# Patient Record
Sex: Female | Born: 1970 | Race: Black or African American | Hispanic: No | Marital: Single | Smoking: Never smoker
Health system: Southern US, Community
[De-identification: ages and names within clinical notes are randomized; demographics above are authoritative.]

---

## 1997-07-26 ENCOUNTER — Encounter (HOSPITAL_COMMUNITY): Admission: RE | Admit: 1997-07-26 | Discharge: 1997-08-02 | Payer: Self-pay | Admitting: Obstetrics & Gynecology

## 1997-07-31 ENCOUNTER — Inpatient Hospital Stay (HOSPITAL_COMMUNITY): Admission: AD | Admit: 1997-07-31 | Discharge: 1997-08-03 | Payer: Self-pay | Admitting: *Deleted

## 1998-02-11 ENCOUNTER — Inpatient Hospital Stay (HOSPITAL_COMMUNITY): Admission: AD | Admit: 1998-02-11 | Discharge: 1998-02-11 | Payer: Self-pay | Admitting: Obstetrics & Gynecology

## 1998-05-06 ENCOUNTER — Inpatient Hospital Stay (HOSPITAL_COMMUNITY): Admission: AD | Admit: 1998-05-06 | Discharge: 1998-05-06 | Payer: Self-pay | Admitting: *Deleted

## 1998-07-29 ENCOUNTER — Inpatient Hospital Stay (HOSPITAL_COMMUNITY): Admission: AD | Admit: 1998-07-29 | Discharge: 1998-07-29 | Payer: Self-pay | Admitting: *Deleted

## 1998-10-30 ENCOUNTER — Inpatient Hospital Stay (HOSPITAL_COMMUNITY): Admission: AD | Admit: 1998-10-30 | Discharge: 1998-10-30 | Payer: Self-pay | Admitting: *Deleted

## 1999-02-18 ENCOUNTER — Ambulatory Visit (HOSPITAL_COMMUNITY): Admission: RE | Admit: 1999-02-18 | Discharge: 1999-02-18 | Payer: Self-pay | Admitting: *Deleted

## 1999-08-05 ENCOUNTER — Encounter: Payer: Self-pay | Admitting: Emergency Medicine

## 1999-08-05 ENCOUNTER — Emergency Department (HOSPITAL_COMMUNITY): Admission: EM | Admit: 1999-08-05 | Discharge: 1999-08-05 | Payer: Self-pay | Admitting: Emergency Medicine

## 2001-04-22 ENCOUNTER — Encounter (INDEPENDENT_AMBULATORY_CARE_PROVIDER_SITE_OTHER): Payer: Self-pay

## 2001-04-22 ENCOUNTER — Observation Stay (HOSPITAL_COMMUNITY): Admission: AD | Admit: 2001-04-22 | Discharge: 2001-04-23 | Payer: Self-pay | Admitting: Obstetrics and Gynecology

## 2003-12-25 ENCOUNTER — Other Ambulatory Visit: Admission: RE | Admit: 2003-12-25 | Discharge: 2003-12-25 | Payer: Self-pay | Admitting: Obstetrics and Gynecology

## 2004-03-26 ENCOUNTER — Ambulatory Visit: Payer: Self-pay | Admitting: Pulmonary Disease

## 2006-09-30 ENCOUNTER — Ambulatory Visit: Payer: Self-pay | Admitting: Pulmonary Disease

## 2006-09-30 LAB — CONVERTED CEMR LAB
ALT: 22 units/L (ref 0–35)
AST: 19 units/L (ref 0–37)
Albumin: 4.1 g/dL (ref 3.5–5.2)
Alkaline Phosphatase: 32 units/L — ABNORMAL LOW (ref 39–117)
BUN: 12 mg/dL (ref 6–23)
Basophils Absolute: 0 10*3/uL (ref 0.0–0.1)
Basophils Relative: 0.3 % (ref 0.0–1.0)
Bilirubin Urine: NEGATIVE
Bilirubin, Direct: 0.1 mg/dL (ref 0.0–0.3)
CO2: 30 meq/L (ref 19–32)
Calcium: 9.7 mg/dL (ref 8.4–10.5)
Chloride: 106 meq/L (ref 96–112)
Cholesterol: 167 mg/dL (ref 0–200)
Creatinine, Ser: 0.8 mg/dL (ref 0.4–1.2)
Crystals: NEGATIVE
Eosinophils Absolute: 0.1 10*3/uL (ref 0.0–0.6)
Eosinophils Relative: 3.1 % (ref 0.0–5.0)
GFR calc Af Amer: 105 mL/min
GFR calc non Af Amer: 87 mL/min
Glucose, Bld: 91 mg/dL (ref 70–99)
HCT: 36.6 % (ref 36.0–46.0)
HDL: 48.8 mg/dL (ref >39.0)
Hemoglobin: 12.4 g/dL (ref 12.0–15.0)
Ketones, ur: NEGATIVE mg/dL
LDL Cholesterol: 112 mg/dL — ABNORMAL HIGH (ref 0–99)
Leukocytes, UA: NEGATIVE
Lymphocytes Relative: 31.8 % (ref 12.0–46.0)
MCHC: 33.9 g/dL (ref 30.0–36.0)
MCV: 89 fL (ref 78.0–100.0)
Monocytes Absolute: 0.3 10*3/uL (ref 0.2–0.7)
Monocytes Relative: 6.5 % (ref 3.0–11.0)
Mucus, UA: NEGATIVE
Neutro Abs: 2.8 10*3/uL (ref 1.4–7.7)
Neutrophils Relative %: 58.3 % (ref 43.0–77.0)
Nitrite: POSITIVE — AB
Platelets: 221 10*3/uL (ref 150–400)
Potassium: 4.1 meq/L (ref 3.5–5.1)
RBC: 4.11 M/uL (ref 3.87–5.11)
RDW: 13.7 % (ref 11.5–14.6)
Sodium: 141 meq/L (ref 135–145)
Specific Gravity, Urine: >=1.03 (ref 1.000–1.03)
TSH: 0.93 microintl units/mL (ref 0.35–5.50)
Total Bilirubin: 1.2 mg/dL (ref 0.3–1.2)
Total CHOL/HDL Ratio: 3.4
Total Protein, Urine: NEGATIVE mg/dL
Total Protein: 7.1 g/dL (ref 6.0–8.3)
Triglycerides: 29 mg/dL (ref 0–149)
Urine Glucose: NEGATIVE mg/dL
Urobilinogen, UA: 0.2 (ref 0.0–1.0)
VLDL: 6 mg/dL (ref 0–40)
WBC: 4.7 10*3/uL (ref 4.5–10.5)
pH: 6 (ref 5.0–8.0)

## 2009-11-07 ENCOUNTER — Encounter: Admission: RE | Admit: 2009-11-07 | Discharge: 2009-11-07 | Payer: Self-pay | Admitting: Infectious Diseases

## 2010-08-08 NOTE — Op Note (Signed)
[facility]  Patient:    Ann Terry, CIVIL Visit Number: [id number] MRN: [id number]          Service Type: EMS Location: [facility] Attending Physician:  Corlis Leak. Dictated by:   Janeece Riggers Dareen Piano, M.D. Proc. Date: 04/22/01 Admit Date:  04/22/2001 Discharge Date: 04/22/2001                             Operative Report  PREOPERATIVE DIAGNOSIS:       Left ectopic pregnancy.  POSTOPERATIVE DIAGNOSIS:      Left ectopic pregnancy with extensive pelvic                               adhesions.  OPERATION:  SURGEON:                      Mark E. Dareen Piano, M.D.  ANESTHESIA:                   General endotracheal.  ANTIBIOTICS:                  Cefotan 1 g.  DRAINS:                       Red rubber catheter in the bladder.  ESTIMATED BLOOD LOSS:         75 cc.  SPECIMENS:                    Left tube and ectopic pregnancy sent to                               pathology.  FINDINGS:                     The patient has a normal-appearing liver.  The gallbladder was not visualized.  The patient did have an omental adhesion to the anterior abdominal wall near the umbilicus.  This was taken down with sharp dissection at the conclusion of the procedure.  In the pelvis, the patient had an enlarged uterus which was boggy consistent with pregnancy.  The patient had approximately 1 L of blood in her abdomen.  The patient had evidence of past tubal ligation.  The left tube was dilated consistent with an ectopic pregnancy.  The right tube appeared to be normal.  However, was adherent to the posterior cul-de-sac.  In the posterior cul-de-sac, there was a 3-4 cm area of what appeared to necrotic tissue in the posterior cul-de-sac. There was no obvious ruptured diverticula or evidence of perforation of the bowel.  Both ovaries were visualized.  However, they were encapsuled in a significant amount of scar tissue.  INDICATIONS:                  The patient is a  40 year old black female, G2, P2, status post tubal ligation approximately three years ago by Dr. Deniece Ree.  The patient presented to [facility] complaining of a three week history of low abdominal pain.  She was worked up over there and found to have a positive pregnancy test, and an ultrasound was performed which revealed findings consistent with a left ectopic pregnancy.  The patient also had evidence of an urinary tract infection.  Prior to having  the procedure performed, I explained to the patient that she will need to have a left salpingectomy in light of her past tubal ligation.  The patient expressed her understanding of this prior to the procedure.  DESCRIPTION OF PROCEDURE:     The patient was taken to the operating room where she was placed in the dorsal supine position.  A general anesthetic was administered without complication.  She was then placed in the dorsolithotomy position, and prepped with Hibiclens.  The bladder was drained with a red rubber catheter.  A Hulka tenaculum was applied to the anterior cervical lip. The patient was then draped in the usual fashion for this procedure.  Her umbilicus was injected with 0.25% Marcaine.  A vertical skin incision was made.  The Veress needle was then placed into the peritoneal cavity and 3 L of carbon dioxide was insufflated.  The 10 mm trocar was then placed into the abdominal cavity.  The scope was then placed and findings were as noted above.  At this point, a 5 mm port was then placed in the suprapubic and left lower quadrant region under direct visualization.  A copious amount of blood and clots were evacuated from the pelvis.  Once this was performed, the ectopic pregnancy could be visualized.  It was adherent to the posterior cul-de-sac to several filmy adhesions.  These were all taken down with blunt dissection. The patient was discovered to have a large necrotic area in the posterior cul-de-sac which  the right fallopian tube was adherent to.  The right fallopian tube was dissected free of this area, and at this point, the fallopian tube on the _________ ovary was checked and felt to be normal.  Once the left fallopian tube was freed, the tripolar instrument was placed through the 5 mm port, and the tube cauterized and transected in the isthmic region.  Once it was freed, hemostasis was checked and felt to be adequate. The 5 mm port in the suprapubic region was then switched to a 10 mm port.  The Endobag was placed in the abdominal cavity and the ectopic pregnancy placed in the Endobag.  This was brought up through the abdominal wall and then removed.  Once this was accomplished, the scope was then placed in the suprapubic region, and scissors were used to take down the omental adhesions near the umbilicus.  Following this, the pelvis was copiously irrigated and found to be hemostatic.  No obvious etiology could be determined for the necrotic area in the posterior cul-de-sac.  The bowel appeared to be normal.  At this point, the procedure was concluded.  The instruments were removed. The pneumoperitoneum released.  The fascia was closed with interrupted 0 Vicryl sutures and the skin closed with interrupted 4-0 Vicryl sutures.  The patient was extubated and taken to the recovery room in stable condition.  She will be kept overnight for observation.  She will be given RhoGAM if her blood type is Rh negative. Dictated by:   Janeece Riggers Dareen Piano, M.D. Attending Physician:  Corlis Leak DD:  04/22/01 TD:  04/25/01 Job: [id number]

## 2010-08-08 NOTE — Op Note (Signed)
[facility]  Patient:    Ann Terry                          MRN: [id number] Proc. Date: 02/18/99 Adm. Date:  [id number] Attending:  Deniece Ree                           Operative Report  PREOPERATIVE DIAGNOSIS:       Multiparity, desirous of permanent sterilization.   POSTOPERATIVE DIAGNOSIS:      Multiparity, desirous of permanent sterilization.   OPERATION:                    Laparoscopic bilateral tubal cauterization with tubal resection.  SURGEON:                      Deniece Ree, M.D.  ASSISTANT:  ANESTHESIA:                   General anesthesia.  ESTIMATED BLOOD LOSS:         Less than 25 cc.  COMPLICATIONS:                None.  CONDITION:                    The patient tolerated the procedure well and returned to the recovery room in satisfactory condition.  DESCRIPTION OF PROCEDURE:     The patient was taken to the operating room and prepped and draped in the usual sterile fashion for sterilization procedure. A speculum was placed in the vagina following which the anterior lip of the cervix was then grasped with a Christella Hartigan tenaculum.  The Acorn uterine manipulating cannula was then put into place.  A subumbilical incision was then made.  This was carried down to the fascia.  At this point, the Veress needle was introduced and approximately 3.5 liters of carbon dioxide was then infused without difficulty.  The laparoscopic trocar was placed through this incision following which the laparoscope was placed through this sleeve.  Visualization of the pelvic organs  came into view.  At this point, the tubes were identified.  The right tube was hen elevated and grasped and cauterized approximately 2 to 3 cm in length.  The left tube was done likewise.  Following which the cauterized segment of tube was then cut into two locations.  At this point, hemostasis remained present.  Sponge, needle, and instrument counts were  correct x 2.  The carbon dioxide was then allowed to escape from the abdominal cavity which it did so without any problems. The incision was then closed first with a deep interrupted stitch followed by a  subcuticular stitch using 4-0 Vicryl.  The procedure was terminated.  The patient tolerated the procedure well and returned to the recovery room in satisfactory condition.  The patient is to be discharged when fully alert.  She has been instructed on the possible complications and care following this type of surgery. She has been told to return to my office in four weeks for follow-up evaluation or to call me prior to that time should any problems arise. DD:  02/18/99 TD:  02/18/99 Job: [id number]

## 2013-03-07 ENCOUNTER — Emergency Department (INDEPENDENT_AMBULATORY_CARE_PROVIDER_SITE_OTHER): Payer: Self-pay

## 2013-03-07 ENCOUNTER — Emergency Department (INDEPENDENT_AMBULATORY_CARE_PROVIDER_SITE_OTHER)
Admission: EM | Admit: 2013-03-07 | Discharge: 2013-03-07 | Disposition: A | Discharge status: Home / Self Care | Payer: Self-pay | Source: Home / Self Care

## 2013-03-07 ENCOUNTER — Encounter (HOSPITAL_COMMUNITY): Payer: Self-pay | Admitting: Emergency Medicine

## 2013-03-07 DIAGNOSIS — J069 Acute upper respiratory infection, unspecified: Secondary | ICD-10-CM

## 2013-03-07 LAB — POCT RAPID STREP A: Streptococcus, Group A Screen (Direct): NEGATIVE

## 2013-03-07 MED ORDER — IPRATROPIUM BROMIDE 0.06 % NA SOLN: 2.0000 | Freq: Four times a day (QID) | NASAL | Status: DC

## 2013-03-07 NOTE — ED Provider Notes (Signed)
CSN: [id number]     Arrival date & time 03/07/13  1109 History   None    Chief Complaint  Patient presents with  . Sore Throat   (Consider location/radiation/quality/duration/timing/severity/associated sxs/prior Treatment) Patient is a 42 y.o. female presenting with pharyngitis. The history is provided by the patient.  Sore Throat This is a new problem. The current episode started more than 1 week ago (51mo of sx ). The problem has not changed since onset.Pertinent negatives include no chest pain, no abdominal pain and no shortness of breath.    History reviewed. No pertinent past medical history. History reviewed. No pertinent past surgical history. History reviewed. No pertinent family history. History  Substance Use Topics  . Smoking status: Never Smoker   . Smokeless tobacco: Not on file  . Alcohol Use: Yes   OB History   Grav Para Term Preterm Abortions TAB SAB Ect Mult Living                 Review of Systems  Constitutional: Negative for fever.  HENT: Positive for congestion, postnasal drip and rhinorrhea.   Respiratory: Positive for cough. Negative for shortness of breath.   Cardiovascular: Negative for chest pain, palpitations and leg swelling.  Gastrointestinal: Negative.  Negative for abdominal pain.  Musculoskeletal: Negative.     Allergies  Review of patient's allergies indicates no known allergies.  Home Medications   Current Outpatient Rx  Name  Route  Sig  Dispense  Refill  . ipratropium (ATROVENT) 0.06 % nasal spray   Nasal   Place 2 sprays into the nose 4 (four) times daily.   15 mL   2    BP 162/93  Pulse 71  Temp(Src) 97.9 F (36.6 C) (Oral)  Resp 16  SpO2 100%  LMP 02/21/2013 Physical Exam  Nursing note and vitals reviewed. Constitutional: She is oriented to person, place, and time. She appears well-developed and well-nourished.  HENT:  Head: Normocephalic.  Right Ear: External ear normal.  Left Ear: External ear normal.   Mouth/Throat: Oropharynx is clear and moist.  Eyes: Conjunctivae are normal. Pupils are equal, round, and reactive to light.  Neck: Normal range of motion. Neck supple.  Cardiovascular: Normal rate, regular rhythm, normal heart sounds and intact distal pulses.   Pulmonary/Chest: Effort normal and breath sounds normal.  Lymphadenopathy:    She has no cervical adenopathy.  Neurological: She is alert and oriented to person, place, and time.  Skin: Skin is warm and dry.    ED Course  Procedures (including critical care time) Labs Review Labs Reviewed  POCT RAPID STREP A ([facility] URG CARE ONLY)   Imaging Review Dg Chest 2 View  03/07/2013   CLINICAL DATA:  Cough and congestion.  EXAM: CHEST  2 VIEW  COMPARISON:  11/07/2009  FINDINGS: The heart size and mediastinal contours are within normal limits. Both lungs are clear. The visualized skeletal structures are unremarkable.  IMPRESSION: No active cardiopulmonary disease.   Electronically Signed   By: Richarda Overlie M.D.   On: 03/07/2013 13:18    EKG Interpretation    Date/Time:    Ventricular Rate:    PR Interval:    QRS Duration:   QT Interval:    QTC Calculation:   R Axis:     Text Interpretation:              MDM  X-rays reviewed and report per radiologist.     Linna Hoff, MD 03/07/13 1325

## 2013-03-07 NOTE — ED Notes (Signed)
Pt  Reports      Symptoms  Of  sorethroat             Tightness  In  Chest        Body  Aches      And    Coughing   With  The  Symptoms          Beginning  About         1  Month   Ago

## 2013-03-09 LAB — CULTURE, GROUP A STREP

## 2014-06-04 ENCOUNTER — Telehealth: Payer: Self-pay | Admitting: Pulmonary Disease

## 2014-06-04 NOTE — Telephone Encounter (Signed)
lmtcb X1 for pt  

## 2014-06-05 NOTE — Telephone Encounter (Signed)
lmomtcb x 2  

## 2014-06-06 NOTE — Telephone Encounter (Signed)
Lmtcb. Will close per triage protocol.  

## 2014-06-21 ENCOUNTER — Other Ambulatory Visit: Payer: Self-pay | Admitting: Obstetrics and Gynecology

## 2014-06-25 ENCOUNTER — Other Ambulatory Visit: Payer: Self-pay | Admitting: Obstetrics and Gynecology

## 2014-06-25 DIAGNOSIS — R928 Other abnormal and inconclusive findings on diagnostic imaging of breast: Secondary | ICD-10-CM

## 2014-06-27 LAB — CYTOLOGY - PAP

## 2014-07-13 ENCOUNTER — Other Ambulatory Visit: Payer: Self-pay

## 2014-08-10 ENCOUNTER — Other Ambulatory Visit: Payer: Self-pay | Admitting: Obstetrics and Gynecology

## 2014-08-10 ENCOUNTER — Ambulatory Visit
Admission: RE | Admit: 2014-08-10 | Discharge: 2014-08-10 | Disposition: A | Discharge status: Home / Self Care | Payer: BLUE CROSS/BLUE SHIELD | Source: Ambulatory Visit | Attending: Obstetrics and Gynecology | Admitting: Obstetrics and Gynecology

## 2014-08-10 DIAGNOSIS — R928 Other abnormal and inconclusive findings on diagnostic imaging of breast: Secondary | ICD-10-CM

## 2014-08-10 DIAGNOSIS — N6320 Unspecified lump in the left breast, unspecified quadrant: Secondary | ICD-10-CM

## 2014-08-16 ENCOUNTER — Ambulatory Visit
Admission: RE | Admit: 2014-08-16 | Discharge: 2014-08-16 | Disposition: A | Discharge status: Home / Self Care | Payer: BLUE CROSS/BLUE SHIELD | Source: Ambulatory Visit | Attending: Obstetrics and Gynecology | Admitting: Obstetrics and Gynecology

## 2014-08-16 ENCOUNTER — Other Ambulatory Visit: Payer: Self-pay | Admitting: Diagnostic Radiology

## 2014-08-16 ENCOUNTER — Other Ambulatory Visit (HOSPITAL_COMMUNITY): Payer: Self-pay | Admitting: Diagnostic Radiology

## 2014-08-16 ENCOUNTER — Other Ambulatory Visit: Payer: Self-pay | Admitting: Obstetrics and Gynecology

## 2014-08-16 DIAGNOSIS — N6320 Unspecified lump in the left breast, unspecified quadrant: Secondary | ICD-10-CM

## 2015-02-04 ENCOUNTER — Ambulatory Visit
Admission: EM | Admit: 2015-02-04 | Discharge: 2015-02-04 | Disposition: A | Discharge status: Home / Self Care | Payer: BLUE CROSS/BLUE SHIELD | Attending: Family Medicine | Admitting: Family Medicine

## 2015-02-04 ENCOUNTER — Encounter: Payer: Self-pay | Admitting: Emergency Medicine

## 2015-02-04 DIAGNOSIS — K529 Noninfective gastroenteritis and colitis, unspecified: Secondary | ICD-10-CM | POA: Diagnosis not present

## 2015-02-04 DIAGNOSIS — N390 Urinary tract infection, site not specified: Secondary | ICD-10-CM | POA: Diagnosis not present

## 2015-02-04 DIAGNOSIS — M545 Low back pain, unspecified: Secondary | ICD-10-CM

## 2015-02-04 LAB — URINALYSIS COMPLETE WITH MICROSCOPIC (ARMC ONLY)
Bilirubin Urine: NEGATIVE
Glucose, UA: NEGATIVE mg/dL
Ketones, ur: NEGATIVE mg/dL
Nitrite: POSITIVE — AB
PH: 5.5 (ref 5.0–8.0)
PROTEIN: NEGATIVE mg/dL
SPECIFIC GRAVITY, URINE: 1.02 (ref 1.005–1.030)

## 2015-02-04 MED ORDER — ACETAMINOPHEN 500 MG PO TABS: 1000.0000 mg | ORAL_TABLET | Freq: Four times a day (QID) | ORAL | Status: AC | PRN

## 2015-02-04 MED ORDER — ONDANSETRON 8 MG PO TBDP: 8.0000 mg | ORAL_TABLET | Freq: Two times a day (BID) | ORAL | Status: AC | PRN

## 2015-02-04 MED ORDER — SULFAMETHOXAZOLE-TRIMETHOPRIM 800-160 MG PO TABS: 1.0000 | ORAL_TABLET | Freq: Two times a day (BID) | ORAL | Status: AC

## 2015-02-04 MED ORDER — PHENAZOPYRIDINE HCL 200 MG PO TABS: 200.0000 mg | ORAL_TABLET | Freq: Three times a day (TID) | ORAL | Status: AC

## 2015-02-04 NOTE — Discharge Instructions (Signed)
Urinary Tract Infection °Urinary tract infections (UTIs) can develop anywhere along your urinary tract. Your urinary tract is your body's drainage system for removing wastes and extra water. Your urinary tract includes two kidneys, two ureters, a bladder, and a urethra. Your kidneys are a pair of bean-shaped organs. Each kidney is about the size of your fist. They are located below your ribs, one on each side of your spine. °CAUSES °Infections are caused by microbes, which are microscopic organisms, including fungi, viruses, and bacteria. These organisms are so small that they can only be seen through a microscope. Bacteria are the microbes that most commonly cause UTIs. °SYMPTOMS  °Symptoms of UTIs may vary by age and gender of the patient and by the location of the infection. Symptoms in young women typically include a frequent and intense urge to urinate and a painful, burning feeling in the bladder or urethra during urination. Older women and men are more likely to be tired, shaky, and weak and have muscle aches and abdominal pain. A fever may mean the infection is in your kidneys. Other symptoms of a kidney infection include pain in your back or sides below the ribs, nausea, and vomiting. °DIAGNOSIS °To diagnose a UTI, your caregiver will ask you about your symptoms. Your caregiver will also ask you to provide a urine sample. The urine sample will be tested for bacteria and white blood cells. White blood cells are made by your body to help fight infection. °TREATMENT  °Typically, UTIs can be treated with medication. Because most UTIs are caused by a bacterial infection, they usually can be treated with the use of antibiotics. The choice of antibiotic and length of treatment depend on your symptoms and the type of bacteria causing your infection. °HOME CARE INSTRUCTIONS °· If you were prescribed antibiotics, take them exactly as your caregiver instructs you. Finish the medication even if you feel better after  you have only taken some of the medication. °· Drink enough water and fluids to keep your urine clear or pale yellow. °· Avoid caffeine, tea, and carbonated beverages. They tend to irritate your bladder. °· Empty your bladder often. Avoid holding urine for long periods of time. °· Empty your bladder before and after sexual intercourse. °· After a bowel movement, women should cleanse from front to back. Use each tissue only once. °SEEK MEDICAL CARE IF:  °· You have back pain. °· You develop a fever. °· Your symptoms do not begin to resolve within 3 days. °SEEK IMMEDIATE MEDICAL CARE IF:  °· You have severe back pain or lower abdominal pain. °· You develop chills. °· You have nausea or vomiting. °· You have continued burning or discomfort with urination. °MAKE SURE YOU:  °· Understand these instructions. °· Will watch your condition. °· Will get help right away if you are not doing well or get worse. °  °This information is not intended to replace advice given to you by your health care provider. Make sure you discuss any questions you have with your health care provider. °  °Document Released: 12/17/2004 Document Revised: 11/28/2014 Document Reviewed: 04/17/2011 °Elsevier Interactive Patient Education ©2016 [employer] ° ° °Viral Gastroenteritis °Viral gastroenteritis is also known as stomach flu. This condition affects the stomach and intestinal tract. It can cause sudden diarrhea and vomiting. The illness typically lasts 3 to 8 days. Most people develop an immune response that eventually gets rid of the virus. While this natural response develops, the virus can make you quite ill. °CAUSES  °  Many different viruses can cause gastroenteritis, such as rotavirus or noroviruses. You can catch one of these viruses by consuming contaminated food or water. You may also catch a virus by sharing utensils or other personal items with an infected person or by touching a contaminated surface. °SYMPTOMS  °The most common  symptoms are diarrhea and vomiting. These problems can cause a severe loss of body fluids (dehydration) and a body salt (electrolyte) imbalance. Other symptoms may include: °· Fever. °· Headache. °· Fatigue. °· Abdominal pain. °DIAGNOSIS  °Your caregiver can usually diagnose viral gastroenteritis based on your symptoms and a physical exam. A stool sample may also be taken to test for the presence of viruses or other infections. °TREATMENT  °This illness typically goes away on its own. Treatments are aimed at rehydration. The most serious cases of viral gastroenteritis involve vomiting so severely that you are not able to keep fluids down. In these cases, fluids must be given through an intravenous line (IV). °HOME CARE INSTRUCTIONS  °· Drink enough fluids to keep your urine clear or pale yellow. Drink small amounts of fluids frequently and increase the amounts as tolerated. °· Ask your caregiver for specific rehydration instructions. °· Avoid: °· Foods high in sugar. °· Alcohol. °· Carbonated drinks. °· Tobacco. °· Juice. °· Caffeine drinks. °· Extremely hot or cold fluids. °· Fatty, greasy foods. °· Too much intake of anything at one time. °· Dairy products until 24 to 48 hours after diarrhea stops. °· You may consume probiotics. Probiotics are active cultures of beneficial bacteria. They may lessen the amount and number of diarrheal stools in adults. Probiotics can be found in yogurt with active cultures and in supplements. °· Wash your hands well to avoid spreading the virus. °· Only take over-the-counter or prescription medicines for pain, discomfort, or fever as directed by your caregiver. Do not give aspirin to children. Antidiarrheal medicines are not recommended. °· Ask your caregiver if you should continue to take your regular prescribed and over-the-counter medicines. °· Keep all follow-up appointments as directed by your caregiver. °SEEK IMMEDIATE MEDICAL CARE IF:  °· You are unable to keep fluids  down. °· You do not urinate at least once every 6 to 8 hours. °· You develop shortness of breath. °· You notice blood in your stool or vomit. This may look like coffee grounds. °· You have abdominal pain that increases or is concentrated in one small area (localized). °· You have persistent vomiting or diarrhea. °· You have a fever. °· The patient is a child younger than 3 months, and he or she has a fever. °· The patient is a child older than 3 months, and he or she has a fever and persistent symptoms. °· The patient is a child older than 3 months, and he or she has a fever and symptoms suddenly get worse. °· The patient is a baby, and he or she has no tears when crying. °MAKE SURE YOU:  °· Understand these instructions. °· Will watch your condition. °· Will get help right away if you are not doing well or get worse. °  °This information is not intended to replace advice given to you by your health care provider. Make sure you discuss any questions you have with your health care provider. °  °Document Released: 03/09/2005 Document Revised: 06/01/2011 Document Reviewed: 12/24/2010 °Elsevier Interactive Patient Education ©2016 [employer] ° °

## 2015-02-04 NOTE — ED Notes (Signed)
Patient c/o lower back pain, lower abdominal pain, burning when urinating, and increase in urinary frequency for 2 months.  Patient reports chills

## 2015-02-05 NOTE — ED Provider Notes (Signed)
CSN: [id number]     Arrival date & time 02/04/15  1252 History   First MD Initiated Contact with Patient 02/04/15 1512     Chief Complaint  Patient presents with  . Abdominal Pain  . Dysuria  . Back Pain   (Consider location/radiation/quality/duration/timing/severity/associated sxs/prior Treatment) HPI Comments: Single african [place] female here for evaluation of lumbar pain, bilateral groin pain, nausea, vomiting last week, headache worst of her headaches, slight headache today, foul odor intermittently to urine denied gross blood.  Urinary frequency x 2 months.  Intermittent burning with urination worsening this week.  + chills  Denied arm/leg weakness, saddle paresthesias or loss of bowel/bladder control.  Denied personal or family history of kidney stones  Patient is a 44 y.o. female presenting with abdominal pain, dysuria, and back pain. The history is provided by the patient.  Abdominal Pain Pain location:  LLQ and RLQ Pain quality: aching   Pain quality: not bloating, not burning, not cramping, not dull, no fullness, not gnawing, not heavy, no pressure, not sharp, not shooting, not squeezing, not stabbing, no stiffness, not tearing, not throbbing and not tugging   Pain radiates to:  Does not radiate Pain severity:  Moderate Onset quality:  Sudden Associated symptoms: chills, dysuria, hematuria, nausea and vomiting   Associated symptoms: no chest pain, no constipation, no cough, no diarrhea, no fatigue, no fever, no sore throat, no vaginal bleeding and no vaginal discharge   Dysuria Associated symptoms: abdominal pain, flank pain, nausea and vomiting   Associated symptoms: no fever and no vaginal discharge   Back Pain Associated symptoms: abdominal pain, dysuria and headaches   Associated symptoms: no chest pain, no fever, no numbness, no pelvic pain and no weakness     History reviewed. No pertinent past medical history. Past Surgical History  Procedure Laterality Date  .  Cesarean section     History reviewed. No pertinent family history. Social History  Substance Use Topics  . Smoking status: Never Smoker   . Smokeless tobacco: None  . Alcohol Use: Yes   OB History    No data available     Review of Systems  Constitutional: Positive for chills. Negative for fever, diaphoresis, activity change, appetite change, fatigue and unexpected weight change.  HENT: Negative for congestion, dental problem, drooling, ear discharge, ear pain, facial swelling, hearing loss, mouth sores, nosebleeds, postnasal drip, rhinorrhea, sinus pressure, sneezing, sore throat, trouble swallowing and voice change.   Eyes: Negative for pain and discharge.  Respiratory: Negative for cough, choking, chest tightness and wheezing.   Cardiovascular: Negative for chest pain and leg swelling.  Gastrointestinal: Positive for nausea, vomiting and abdominal pain. Negative for diarrhea, constipation, blood in stool, abdominal distention and anal bleeding.  Endocrine: Negative for cold intolerance and heat intolerance.  Genitourinary: Positive for dysuria, frequency, hematuria and flank pain. Negative for urgency, decreased urine volume, vaginal bleeding, vaginal discharge, enuresis, difficulty urinating, genital sores, vaginal pain, menstrual problem and pelvic pain.  Musculoskeletal: Positive for back pain. Negative for myalgias and arthralgias.  Skin: Negative for rash.  Allergic/Immunologic: Negative for environmental allergies and food allergies.  Neurological: Positive for headaches. Negative for dizziness, tremors, seizures, syncope, facial asymmetry, speech difficulty, weakness, light-headedness and numbness.  Hematological: Negative for adenopathy. Does not bruise/bleed easily.  Psychiatric/Behavioral: Negative for behavioral problems, confusion, sleep disturbance and agitation. The patient is not nervous/anxious.     Allergies  Review of patient's allergies indicates no known  allergies.  Home Medications   Prior to  Admission medications   Medication Sig Start Date End Date Taking? Authorizing Provider  acetaminophen (TYLENOL) 500 MG tablet Take 2 tablets (1,000 mg total) by mouth every 6 (six) hours as needed for moderate pain, fever or headache. 02/04/15 02/10/15  Barbaraann Barthel, NP  ipratropium (ATROVENT) 0.06 % nasal spray Place 2 sprays into the nose 4 (four) times daily. 03/07/13   Linna Hoff, MD  ondansetron (ZOFRAN ODT) 8 MG disintegrating tablet Take 1 tablet (8 mg total) by mouth 2 (two) times daily as needed for nausea or vomiting. 02/04/15 02/06/15  Barbaraann Barthel, NP  phenazopyridine (PYRIDIUM) 200 MG tablet Take 1 tablet (200 mg total) by mouth 3 (three) times daily. 02/04/15 02/05/15  Barbaraann Barthel, NP  sulfamethoxazole-trimethoprim (BACTRIM DS,SEPTRA DS) 800-160 MG tablet Take 1 tablet by mouth 2 (two) times daily. 02/04/15 02/10/15  Barbaraann Barthel, NP   Meds Ordered and Administered this Visit  Medications - No data to display  BP 142/80 mmHg  Pulse 72  Temp(Src) 97.3 F (36.3 C) (Tympanic)  Resp 16  Ht  (1.549 m)  Wt 178 lb (80.74 kg)  BMI 33.65 kg/m2  SpO2 100%  LMP 01/07/2015 (Exact Date) No data found.   Physical Exam  Constitutional: She is oriented to person, place, and time. She appears well-developed and well-nourished. She is active and cooperative.  Non-toxic appearance. She does not have a sickly appearance. She does not appear ill. No distress.  HENT:  Head: Normocephalic and atraumatic.  Right Ear: Hearing, external ear and ear canal normal.  Left Ear: Hearing, external ear and ear canal normal.  Nose: No mucosal edema, rhinorrhea, nose lacerations, sinus tenderness, nasal deformity, septal deviation or nasal septal hematoma. No epistaxis.  No foreign bodies. Right sinus exhibits no maxillary sinus tenderness and no frontal sinus tenderness. Left sinus exhibits no maxillary sinus tenderness and no frontal  sinus tenderness.  Mouth/Throat: Uvula is midline and mucous membranes are normal. Mucous membranes are not pale, not dry and not cyanotic. She does not have dentures. No oral lesions. No trismus in the jaw. Normal dentition. No dental abscesses, uvula swelling, lacerations or dental caries. No oropharyngeal exudate, posterior oropharyngeal edema, posterior oropharyngeal erythema or tonsillar abscesses.  Eyes: Conjunctivae, EOM and lids are normal. Pupils are equal, round, and reactive to light. Right eye exhibits no chemosis, no discharge, no exudate and no hordeolum. No foreign body present in the right eye. Left eye exhibits no chemosis, no discharge, no exudate and no hordeolum. No foreign body present in the left eye. Right conjunctiva is not injected. Right conjunctiva has no hemorrhage. Left conjunctiva is not injected. Left conjunctiva has no hemorrhage. No scleral icterus. Right eye exhibits normal extraocular motion and no nystagmus. Left eye exhibits normal extraocular motion and no nystagmus. Right pupil is round and reactive. Left pupil is round and reactive. Pupils are equal.  Neck: Trachea normal and normal range of motion. Neck supple. No tracheal tenderness, no spinous process tenderness and no muscular tenderness present. No rigidity. No tracheal deviation, no edema, no erythema and normal range of motion present. No thyroid mass and no thyromegaly present.  Cardiovascular: Normal rate, regular rhythm, S1 normal, S2 normal, normal heart sounds and intact distal pulses.  PMI is not displaced.  Exam reveals no gallop and no friction rub.   No murmur heard. Pulses:      Radial pulses are 2+ on the right side, and 2+ on the left side.  Pulmonary/Chest: Effort  normal and breath sounds normal. No accessory muscle usage or stridor. No respiratory distress. She has no decreased breath sounds. She has no wheezes. She has no rhonchi. She has no rales. She exhibits no tenderness.  Abdominal: Soft.  Bowel sounds are normal. She exhibits no shifting dullness, no distension, no pulsatile liver, no fluid wave, no abdominal bruit, no ascites, no pulsatile midline mass and no mass. There is no hepatosplenomegaly. There is tenderness in the right lower quadrant, suprapubic area and left lower quadrant. There is CVA tenderness. There is no rigidity, no rebound, no guarding, no tenderness at McBurney's point and negative Murphy's sign. Hernia confirmed negative in the ventral area, confirmed negative in the right inguinal area and confirmed negative in the left inguinal area.    TTP RLQ, LLQ, right and left inguinal areas no edema/swelling/hernia/adenopathy noted; bilateral CVA tenderness not exquisite. Bilaterally low lumbar pain across hips does not worsen with palpation worsens with extension/flexion/rotation; dull to percussion x 4 quads  Musculoskeletal: Normal range of motion. She exhibits no edema.       Right shoulder: Normal.       Left shoulder: Normal.       Right hip: Normal.       Left hip: Normal.       Right knee: Normal.       Left knee: Normal.       Cervical back: Normal.       Thoracic back: She exhibits tenderness and pain. She exhibits normal range of motion, no bony tenderness, no swelling, no edema, no deformity, no laceration, no spasm and normal pulse.       Lumbar back: She exhibits pain. She exhibits normal range of motion, no tenderness, no bony tenderness, no swelling, no edema, no deformity, no laceration, no spasm and normal pulse.       Back:       Right hand: Normal.       Left hand: Normal.  Lymphadenopathy:       Head (right side): No submental, no submandibular, no tonsillar, no preauricular, no posterior auricular and no occipital adenopathy present.       Head (left side): No submental, no submandibular, no tonsillar, no preauricular, no posterior auricular and no occipital adenopathy present.    She has no cervical adenopathy.       Right cervical: No  superficial cervical, no deep cervical and no posterior cervical adenopathy present.      Left cervical: No superficial cervical, no deep cervical and no posterior cervical adenopathy present.       Right: No inguinal adenopathy present.       Left: No inguinal adenopathy present.  Neurological: She is alert and oriented to person, place, and time. She has normal strength. She is not disoriented. She displays no atrophy and no tremor. No cranial nerve deficit or sensory deficit. She exhibits normal muscle tone. She displays no seizure activity. Coordination and gait normal. GCS eye subscore is 4. GCS verbal subscore is 5. GCS motor subscore is 6.  Skin: Skin is warm, dry and intact. No abrasion, no bruising, no burn, no ecchymosis, no laceration, no lesion, no petechiae and no rash noted. She is not diaphoretic. No cyanosis or erythema. No pallor. Nails show no clubbing.  Psychiatric: She has a normal mood and affect. Her speech is normal and behavior is normal. Judgment and thought content normal. Cognition and memory are normal.  Nursing note and vitals reviewed.   ED Course  Procedures (including critical care time)  Labs Review Labs Reviewed  URINALYSIS COMPLETEWITH MICROSCOPIC ([facility] ONLY) - Abnormal; Notable for the following:    Hgb urine dipstick 2+ (*)    Nitrite POSITIVE (*)    Leukocytes, UA TRACE (*)    Bacteria, UA MANY (*)    Squamous Epithelial / LPF 0-5 (*)    All other components within normal limits  URINE CULTURE    Imaging Review No results found.  1530 discussed urinalysis results with patient given copy.  Notified patient I would contact her typically 48 hours with urine culture results.  To call clinic if no improvement in her symptoms with 48 hours of antibiotics as may need to change antibiotics.  Consider KUB patient did not want at this time if continued RBCs in urine after antibiotic treatment.  Crystals negative.  [facility]  after antibiotics completed to ensure resolution UTI  Patient verbalized understanding of information/instructions, agreed with plan of care and had no further questions at this time.  MDM   1. UTI (lower urinary tract infection)   2. Gastroenteritis, acute    Medications as directed.  Rx bactrim DS po BID x 7 days and tylenol 1000mg  po QID prn.  Patient is also to push fluids and may use Pyridium po as needed. Call or return to clinic as needed if these symptoms worsen or fail to improve as anticipated.  Exitcare handout on cystitis given to patient Patient verbalized agreement and understanding of treatment plan and had no further questions at this time. P2:  Hydrate, post coital urination, and cranberry juice  Work/school note given to patient for return in 48 hours.  I have recommended clear fluids and the BRATT diet.  Medications as directed. zofran 8mg  ODT po BID prn nausea/vomiting given to patient Rx.  Patient did not want zofran administration in clinic.   Return to the clinic if symptoms persist or worsen; I have alerted the patient to call if high fever, dehydration, marked weakness, fainting, increased abdominal pain, blood in stool or vomit (red or black).   Exitcare handout on gastroenteritis given to patient. Patient verbalized agreement and understanding of treatment plan and had no further questions as needed.   P2:  Hand washing and fitness  For acute pain, rest, and intermittent application of heat (do not sleep on heating pad).  I discussed longer-term treatment plan of PRN PO NSAIDS tylenol 1000mg  po QID prn.  Discussed with patient probably related to UTI but could be other cause also.  I discussed a home back care exercise program with a strengthening and flexibility exercise.  Consider physical therapy or chiropractic care and radiology if not improving.  Call or return to clinic as needed if these symptoms worsen or fail to improve as anticipated especially leg weakness, loss of  bowel/bladder control or saddle paresthesias.   Patient verbalized understanding of instructions/information and agreed with plan of care.  P2:  Injury Prevention, fitness    Barbaraann Barthelina A Eriq Hufford, NP 02/05/15 1252

## 2015-02-06 ENCOUNTER — Telehealth: Payer: Self-pay | Admitting: Family Medicine

## 2015-02-06 LAB — URINE CULTURE: Culture: >=100000

## 2015-02-06 NOTE — Telephone Encounter (Signed)
Telephone message left for patient e. Coli sensitive to all antibiotics patient given bactrim DS at appt if symptoms not improving with medication to contact clinic to notify staff for change in antibiotic.

## 2015-02-15 ENCOUNTER — Telehealth: Payer: Self-pay | Admitting: Family Medicine

## 2015-02-15 NOTE — Telephone Encounter (Signed)
Telephone message left for patient urine culture e.coli sensitive to Bactrim.  Patient instructed to call and speak with nurse if still symptomatic or further questions/concerns.

## 2015-08-01 ENCOUNTER — Other Ambulatory Visit: Payer: Self-pay | Admitting: Obstetrics and Gynecology

## 2015-08-21 ENCOUNTER — Other Ambulatory Visit: Payer: Self-pay | Admitting: Obstetrics and Gynecology

## 2015-08-23 LAB — CYTOLOGY - PAP

## 2015-08-29 ENCOUNTER — Other Ambulatory Visit: Payer: Self-pay | Admitting: Obstetrics and Gynecology

## 2015-08-29 DIAGNOSIS — N63 Unspecified lump in unspecified breast: Secondary | ICD-10-CM

## 2015-09-19 ENCOUNTER — Other Ambulatory Visit: Payer: BLUE CROSS/BLUE SHIELD

## 2015-09-20 ENCOUNTER — Other Ambulatory Visit: Payer: BLUE CROSS/BLUE SHIELD

## 2015-09-26 ENCOUNTER — Other Ambulatory Visit: Payer: Self-pay | Admitting: Specialist

## 2015-09-27 ENCOUNTER — Other Ambulatory Visit: Payer: BLUE CROSS/BLUE SHIELD

## 2015-10-02 ENCOUNTER — Ambulatory Visit
Admission: RE | Admit: 2015-10-02 | Discharge: 2015-10-02 | Disposition: A | Discharge status: Home / Self Care | Payer: BLUE CROSS/BLUE SHIELD | Source: Ambulatory Visit | Attending: Obstetrics and Gynecology | Admitting: Obstetrics and Gynecology

## 2015-10-02 DIAGNOSIS — N63 Unspecified lump in unspecified breast: Secondary | ICD-10-CM

## 2015-10-25 ENCOUNTER — Other Ambulatory Visit: Payer: Self-pay | Admitting: Gastroenterology

## 2015-10-25 DIAGNOSIS — R1031 Right lower quadrant pain: Secondary | ICD-10-CM

## 2015-10-29 ENCOUNTER — Ambulatory Visit
Admission: RE | Admit: 2015-10-29 | Discharge: 2015-10-29 | Disposition: A | Discharge status: Home / Self Care | Payer: BLUE CROSS/BLUE SHIELD | Source: Ambulatory Visit | Attending: Gastroenterology | Admitting: Gastroenterology

## 2015-10-29 DIAGNOSIS — R1031 Right lower quadrant pain: Secondary | ICD-10-CM

## 2015-10-29 MED ORDER — IOPAMIDOL (ISOVUE-300) INJECTION 61%
100.0000 mL | Freq: Once | INTRAVENOUS | Status: AC | PRN
  Administered 2015-10-29: 100 mL via INTRAVENOUS

## 2015-12-20 ENCOUNTER — Ambulatory Visit (INDEPENDENT_AMBULATORY_CARE_PROVIDER_SITE_OTHER): Payer: Worker's Compensation

## 2015-12-20 ENCOUNTER — Ambulatory Visit (INDEPENDENT_AMBULATORY_CARE_PROVIDER_SITE_OTHER): Payer: Worker's Compensation | Admitting: Physician Assistant

## 2015-12-20 DIAGNOSIS — M25562 Pain in left knee: Secondary | ICD-10-CM

## 2015-12-20 DIAGNOSIS — M542 Cervicalgia: Secondary | ICD-10-CM

## 2015-12-20 DIAGNOSIS — M62838 Other muscle spasm: Secondary | ICD-10-CM | POA: Diagnosis not present

## 2015-12-20 MED ORDER — CYCLOBENZAPRINE HCL 5 MG PO TABS: 5.0000 mg | ORAL_TABLET | Freq: Three times a day (TID) | ORAL | 0 refills | Status: AC | PRN

## 2015-12-20 MED ORDER — MELOXICAM 7.5 MG PO TABS: 7.5000 mg | ORAL_TABLET | Freq: Every day | ORAL | 0 refills | Status: DC

## 2015-12-20 NOTE — Patient Instructions (Addendum)
  Heat to the sore muscle Ice to the knee  Take medications as needed  Recheck in a week    IF you received an x-ray today, you will [facility] an [facility]. Please contact [facility] at [phone] with questions or concerns regarding your invoice.   IF you received labwork today, you will receive an invoice from [facility]. Please contact [facility] at [phone] with questions or concerns regarding your invoice.   Our billing staff will not be able to assist you with questions regarding bills from these companies.  You will be contacted with the lab results as soon as they are available. The fastest way to get your results is to activate your My Chart account. Instructions are located on the last page of this paperwork. If you have not heard from us regarding the results in 2 weeks, please contact this office.
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
## 2015-12-27 ENCOUNTER — Ambulatory Visit (INDEPENDENT_AMBULATORY_CARE_PROVIDER_SITE_OTHER): Payer: Worker's Compensation | Admitting: Physician Assistant

## 2015-12-27 VITALS — BP 114/70 | HR 72 | Temp 98.8°F | Resp 16 | Ht 63.0 in | Wt 186.0 lb

## 2015-12-27 DIAGNOSIS — M542 Cervicalgia: Secondary | ICD-10-CM | POA: Diagnosis not present

## 2015-12-27 DIAGNOSIS — Y990 Civilian activity done for income or pay: Secondary | ICD-10-CM | POA: Diagnosis not present

## 2015-12-27 DIAGNOSIS — M25562 Pain in left knee: Secondary | ICD-10-CM

## 2015-12-27 MED ORDER — MELOXICAM 7.5 MG PO TABS: 7.5000 mg | ORAL_TABLET | Freq: Every day | ORAL | 0 refills | Status: AC

## 2015-12-27 NOTE — Patient Instructions (Signed)
     IF you received an x-ray today, you will receive an invoice from [facility]. Please contact [facility] at [phone] with questions or concerns regarding your invoice.   IF you received labwork today, you will receive an invoice from [facility]. Please contact [facility] at [phone] with questions or concerns regarding your invoice.   Our billing staff will not be able to assist you with questions regarding bills from these companies.  You will be contacted with the lab results as soon as they are available. The fastest way to get your results is to activate your My Chart account. Instructions are located on the last page of this paperwork. If you have not heard from us regarding the results in 2 weeks, please contact this office.      
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
## 2016-01-10 ENCOUNTER — Ambulatory Visit (INDEPENDENT_AMBULATORY_CARE_PROVIDER_SITE_OTHER): Payer: Worker's Compensation

## 2016-01-10 ENCOUNTER — Ambulatory Visit (INDEPENDENT_AMBULATORY_CARE_PROVIDER_SITE_OTHER): Payer: Worker's Compensation | Admitting: Physician Assistant

## 2016-01-10 ENCOUNTER — Encounter: Payer: Self-pay | Admitting: Physician Assistant

## 2016-01-10 VITALS — BP 132/100 | HR 67 | Temp 98.3°F | Resp 16 | Ht 62.0 in | Wt 183.4 lb

## 2016-01-10 DIAGNOSIS — Y990 Civilian activity done for income or pay: Secondary | ICD-10-CM

## 2016-01-10 DIAGNOSIS — M25562 Pain in left knee: Secondary | ICD-10-CM

## 2016-01-10 DIAGNOSIS — M542 Cervicalgia: Secondary | ICD-10-CM | POA: Diagnosis not present
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
## 2016-01-10 NOTE — Patient Instructions (Addendum)
  Heat and stretches to the neck Ice to the knee Medication as we have done in the past weedk  IF you received an x-ray today, you will receive an [facility] from [facility]. Please contact [facility] at [phone] with questions or concerns regarding your invoice.   IF you received labwork today, you will receive an invoice from [facility]. Please contact [facility] at [phone] with questions or concerns regarding your invoice.   Our billing staff will not be able to assist you with questions regarding bills from these companies.  You will be contacted with the lab results as soon as they are available. The fastest way to get your results is to activate your My Chart account. Instructions are located on the last page of this paperwork. If you have not heard from us regarding the results in 2 weeks, please contact this office.

## 2016-04-16 ENCOUNTER — Ambulatory Visit (INDEPENDENT_AMBULATORY_CARE_PROVIDER_SITE_OTHER): Payer: Worker's Compensation | Admitting: Physician Assistant

## 2016-04-16 ENCOUNTER — Encounter: Payer: Self-pay | Admitting: Physician Assistant

## 2016-04-16 VITALS — BP 149/92 | HR 68 | Temp 98.5°F | Ht 62.0 in | Wt 187.4 lb

## 2016-04-16 DIAGNOSIS — M542 Cervicalgia: Secondary | ICD-10-CM | POA: Diagnosis not present

## 2016-04-16 DIAGNOSIS — M25562 Pain in left knee: Secondary | ICD-10-CM | POA: Diagnosis not present

## 2016-04-16 DIAGNOSIS — M62838 Other muscle spasm: Secondary | ICD-10-CM

## 2016-04-16 DIAGNOSIS — S8002XD Contusion of left knee, subsequent encounter: Secondary | ICD-10-CM

## 2016-04-16 DIAGNOSIS — Y990 Civilian activity done for income or pay: Secondary | ICD-10-CM
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
## 2016-04-16 NOTE — Patient Instructions (Addendum)
   Continue doing PT and home exercises.  F/u with me if needed.  IF you received an x-ray today, you will receive an invoice from [facility]. Please contact [facility] at [phone] with questions or concerns regarding your invoice.   IF you received labwork today, you will receive an invoice from [facility]. Please contact [facility] at [phone] with questions or concerns regarding your invoice.   Our billing staff will not be able to assist you with questions regarding bills from these companies.  You will be contacted with the lab results as soon as they are available. The fastest way to get your results is to activate your My Chart account. Instructions are located on the last page of this paperwork. If you have not heard from us regarding the results in 2 weeks, please contact this office.
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
## 2018-04-20 ENCOUNTER — Other Ambulatory Visit: Payer: Self-pay | Admitting: Obstetrics and Gynecology

## 2018-04-20 DIAGNOSIS — R928 Other abnormal and inconclusive findings on diagnostic imaging of breast: Secondary | ICD-10-CM

## 2018-05-02 ENCOUNTER — Ambulatory Visit: Payer: BLUE CROSS/BLUE SHIELD

## 2018-05-02 ENCOUNTER — Ambulatory Visit
Admission: RE | Admit: 2018-05-02 | Discharge: 2018-05-02 | Disposition: A | Discharge status: Home / Self Care | Payer: BLUE CROSS/BLUE SHIELD | Source: Ambulatory Visit | Attending: Obstetrics and Gynecology | Admitting: Obstetrics and Gynecology

## 2018-05-02 DIAGNOSIS — R928 Other abnormal and inconclusive findings on diagnostic imaging of breast: Secondary | ICD-10-CM

## 2019-10-24 ENCOUNTER — Other Ambulatory Visit: Payer: BLUE CROSS/BLUE SHIELD

## 2020-01-20 ENCOUNTER — Ambulatory Visit: Payer: Self-pay | Attending: Internal Medicine

## 2020-01-20 ENCOUNTER — Other Ambulatory Visit: Payer: Self-pay

## 2020-01-20 DIAGNOSIS — Z23 Encounter for immunization: Secondary | ICD-10-CM

## 2020-01-20 NOTE — Progress Notes (Signed)
° °  Covid-19 Vaccination Clinic  Name:  Ann Terry    MRN: [id number] DOB: [birth year]  01/20/2020  Ann Terry was observed post Covid-19 immunization for 15 minutes without incident. She was provided with Vaccine Information Sheet and instruction to access the V-Safe system.   Ann Terry was instructed to call [phone] with any severe reactions post vaccine:  Difficulty breathing   Swelling of face and throat   A fast heartbeat   A bad rash all over body   Dizziness and weakness

## 2022-02-06 ENCOUNTER — Telehealth: Payer: Self-pay | Admitting: Gastroenterology

## 2022-02-06 NOTE — Telephone Encounter (Signed)
Good Afternoon Dr. Chales Abrahams,  Supervising MD for 11/9 PM   We have received records from patient previous Gastroenterologist Dr. Madilyn Fireman with [facility]. Patient has a referral in to be seen for a colonoscopy. Patients records included patients last procedure from 11/01/2015 along with her pathology report. I will be sending patients records for you to review, will you please review and advise on scheduling?  Thank you.

## 2022-02-27 NOTE — Telephone Encounter (Signed)
Patient will call back to schedule her previsit appointment and colonoscopy with Dr Chales Abrahams. Notes currently in my drawer

## 2022-10-21 ENCOUNTER — Other Ambulatory Visit: Payer: Self-pay | Admitting: Obstetrics and Gynecology

## 2022-10-21 DIAGNOSIS — R928 Other abnormal and inconclusive findings on diagnostic imaging of breast: Secondary | ICD-10-CM

## 2022-10-28 ENCOUNTER — Other Ambulatory Visit: Payer: Self-pay | Admitting: Obstetrics and Gynecology

## 2022-10-28 ENCOUNTER — Ambulatory Visit
Admission: RE | Admit: 2022-10-28 | Discharge: 2022-10-28 | Disposition: A | Discharge status: Home / Self Care | Payer: Self-pay | Source: Ambulatory Visit | Attending: Obstetrics and Gynecology | Admitting: Obstetrics and Gynecology

## 2022-10-28 ENCOUNTER — Ambulatory Visit
Admission: RE | Admit: 2022-10-28 | Discharge: 2022-10-28 | Disposition: A | Discharge status: Home / Self Care | Payer: BC Managed Care – PPO | Source: Ambulatory Visit | Attending: Obstetrics and Gynecology

## 2022-10-28 DIAGNOSIS — R928 Other abnormal and inconclusive findings on diagnostic imaging of breast: Secondary | ICD-10-CM

## 2022-10-28 DIAGNOSIS — N6310 Unspecified lump in the right breast, unspecified quadrant: Secondary | ICD-10-CM

## 2023-10-04 ENCOUNTER — Encounter: Payer: Self-pay | Admitting: Podiatry

## 2023-10-04 ENCOUNTER — Ambulatory Visit: Admitting: Podiatry

## 2023-10-04 DIAGNOSIS — M2042 Other hammer toe(s) (acquired), left foot: Secondary | ICD-10-CM | POA: Diagnosis not present

## 2023-10-04 DIAGNOSIS — D2371 Other benign neoplasm of skin of right lower limb, including hip: Secondary | ICD-10-CM

## 2023-10-04 DIAGNOSIS — D2372 Other benign neoplasm of skin of left lower limb, including hip: Secondary | ICD-10-CM

## 2023-10-04 DIAGNOSIS — L603 Nail dystrophy: Secondary | ICD-10-CM | POA: Diagnosis not present

## 2023-10-04 DIAGNOSIS — M2041 Other hammer toe(s) (acquired), right foot: Secondary | ICD-10-CM | POA: Diagnosis not present

## 2023-10-04 DIAGNOSIS — D2370 Other benign neoplasm of skin of unspecified lower limb, including hip: Secondary | ICD-10-CM | POA: Diagnosis not present

## 2023-10-04 NOTE — Progress Notes (Signed)
  Subjective:  Patient ID: Ann Terry, female    DOB: Mar 31, 1970,  MRN: [id number] HPI Chief Complaint  Patient presents with   Nail Problem    Rm7 nail discoloration and keeps falling off/no pain/1 year    53 y.o. female presents with the above complaint.   ROS: Denies fever chills nausea vomiting muscle aches pains calf pain back pain chest pain shortness of breath.  History reviewed. No pertinent past medical history. Past Surgical History:  Procedure Laterality Date   CESAREAN SECTION      Current Outpatient Medications:    azithromycin (ZITHROMAX) 250 MG tablet, Take by mouth., Disp: , Rfl:    chlorpheniramine-HYDROcodone (TUSSIONEX) 10-8 MG/5ML, Take 5 mLs by mouth., Disp: , Rfl:    cyclobenzaprine  (FLEXERIL ) 5 MG tablet, Take 1 tablet (5 mg total) by mouth 3 (three) times daily as needed for muscle spasms., Disp: 30 tablet, Rfl: 0   GEMTESA 75 MG TABS, Take by oral route for 30 days., Disp: , Rfl:    losartan (COZAAR) 50 MG tablet, Take 50 mg by mouth daily., Disp: , Rfl:    meloxicam  (MOBIC ) 7.5 MG tablet, Take 1-2 tablets (7.5-15 mg total) by mouth daily., Disp: 30 tablet, Rfl: 0  Allergies  Allergen Reactions   Dog Fennel Other (See Comments)    Headache   Gramineae Pollens Other (See Comments)    headaches   Review of Systems Objective:  There were no vitals filed for this visit.  General: Well developed, nourished, in no acute distress, alert and oriented x3   Dermatological: Skin is warm, dry and supple bilateral. Nails x 10 are well maintained with exception of the hallux nail right which is extremely dark with distal onycholysis.  It does appear to be dystrophic but possibly mycotic as well; remaining integument appears unremarkable at this time. There are no open sores, no preulcerative lesions, no rash or signs of infection present.  Reactive benign skin lesions fifth digits bilateral overlying PIPJ  Vascular: Dorsalis Pedis artery and Posterior Tibial  artery pedal pulses are 2/4 bilateral with immedate capillary fill time. Pedal hair growth present. No varicosities and no lower extremity edema present bilateral.   Neruologic: Grossly intact via light touch bilateral. Vibratory intact via tuning fork bilateral. Protective threshold with Semmes Wienstein monofilament intact to all pedal sites bilateral. Patellar and Achilles deep tendon reflexes 2+ bilateral. No Babinski or clonus noted bilateral.   Musculoskeletal: No gross boney pedal deformities bilateral. No pain, crepitus, or limitation noted with foot and ankle range of motion bilateral. Muscular strength 5/5 in all groups tested bilateral.  Hammertoe deformities fifth bilateral resulting in overlying benign skin lesions.  Gait: Unassisted, Nonantalgic.    Radiographs:  None taken  Assessment & Plan:   Assessment: Mild adductovarus rotated hammertoe deformities.  Benign skin lesions fifth digits bilateral.  Nail dystrophy hallux right.  Plan: Debrided benign skin lesions.  Sampled the hallux nail plate right for pathology.  Discussed appropriate shoe gear.  We will follow-up with her in 1 month.     Ann Terry T. [place], [place]

## 2023-10-07 NOTE — Addendum Note (Signed)
 Addended by: ELAYNE KNEE E on: 10/07/2023 07:04 PM   Modules accepted: Level of Service

## 2023-10-19 ENCOUNTER — Ambulatory Visit: Payer: Self-pay | Admitting: Podiatry

## 2023-11-03 ENCOUNTER — Ambulatory Visit: Admitting: Podiatry

## 2023-11-03 DIAGNOSIS — L603 Nail dystrophy: Secondary | ICD-10-CM | POA: Diagnosis not present

## 2023-11-03 MED ORDER — TERBINAFINE HCL 250 MG PO TABS: 250.0000 mg | ORAL_TABLET | Freq: Every day | ORAL | 0 refills | Status: AC

## 2023-11-04 NOTE — Progress Notes (Signed)
 She presents today for follow-up of her nail biopsies.  She states that they are unchanged.  Objective: Vital signs are stable oriented x 3.  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  Assessment: Onychomycosis and nail dystrophy.  Plan: Discussed topical therapy laser therapy and oral therapy.  At this point she would like to start oral therapy.  We dispensed a prescription for Lamisil  250 mg tablets.  She will take 1 tablet daily for the next 30 days follow-up with me at that time for blood work.  We did discuss the side effects and possible complications associated with this drug she understands that is amendable to it we will take the risk.

## 2023-12-13 ENCOUNTER — Ambulatory Visit: Admitting: Podiatry

## 2023-12-13 DIAGNOSIS — Z79899 Other long term (current) drug therapy: Secondary | ICD-10-CM | POA: Diagnosis not present

## 2023-12-13 DIAGNOSIS — D2372 Other benign neoplasm of skin of left lower limb, including hip: Secondary | ICD-10-CM | POA: Diagnosis not present

## 2023-12-13 DIAGNOSIS — L84 Corns and callosities: Secondary | ICD-10-CM

## 2023-12-13 DIAGNOSIS — B351 Tinea unguium: Secondary | ICD-10-CM | POA: Diagnosis not present

## 2023-12-13 MED ORDER — TERBINAFINE HCL 250 MG PO TABS: 250.0000 mg | ORAL_TABLET | Freq: Every day | ORAL | 0 refills | Status: AC

## 2023-12-13 NOTE — Progress Notes (Signed)
 She presents today for follow-up of her Lamisil  therapy.  Denies fever chills nausea vomit muscle aches pains calf pain back pain chest pain shortness of breath rashes.  She did state that she had some itching on the arms while she was taking it.  States that she can tell that her toenails look a little bit lighter.  She has pain today at her callus points on her left foot in particular.  States that these painful areas continue to come back and like to consider surgical intervention at some point.  But right now I think I just need them trimmed.  Objective: Vital signs are stable she is alert and oriented x 3.  Toenails appear to be healing or clearing proximally however this is quite slow and very early in the treatment plan.  Left foot does demonstrate benign skin lesions plantar aspect of the bilateral foot lateral aspect of the foot as well as medial.  These do not demonstrate any signs verrucoid characteristics nor do they demonstrate any signs of skin breakdown.  Assessment: Long-term therapy with Lamisil .  Benign skin lesions plantar aspect bilateral foot.  These were debrided.  Plan: Requested a comprehensive metabolic panel.  Provided her with 90 more Lamisil  tablets.  Debrided benign skin lesions today.  Follow-up with her in 4 months.

## 2024-01-03 ENCOUNTER — Other Ambulatory Visit (HOSPITAL_COMMUNITY)
Admission: RE | Admit: 2024-01-03 | Discharge: 2024-01-03 | Disposition: A | Discharge status: Home / Self Care | Source: Ambulatory Visit | Attending: Podiatry | Admitting: Podiatry

## 2024-01-03 DIAGNOSIS — Z79899 Other long term (current) drug therapy: Secondary | ICD-10-CM | POA: Insufficient documentation

## 2024-01-03 LAB — COMPREHENSIVE METABOLIC PANEL WITH GFR
ALT: 17 U/L (ref 0–44)
AST: 22 U/L (ref 15–41)
Albumin: 4.4 g/dL (ref 3.5–5.0)
Alkaline Phosphatase: 50 U/L (ref 38–126)
Anion gap: 14 (ref 5–15)
BUN: 13 mg/dL (ref 6–20)
CO2: 26 mmol/L (ref 22–32)
Calcium: 9.7 mg/dL (ref 8.9–10.3)
Chloride: 105 mmol/L (ref 98–111)
Creatinine, Ser: 0.89 mg/dL (ref 0.44–1.00)
GFR, Estimated: >60 mL/min (ref >60)
Glucose, Bld: 93 mg/dL (ref 70–99)
Potassium: 4 mmol/L (ref 3.5–5.1)
Sodium: 146 mmol/L — ABNORMAL HIGH (ref 135–145)
Total Bilirubin: 0.4 mg/dL (ref 0.0–1.2)
Total Protein: 7.2 g/dL (ref 6.5–8.1)

## 2024-01-06 ENCOUNTER — Ambulatory Visit: Payer: Self-pay | Admitting: Podiatry

## 2024-01-06 NOTE — Progress Notes (Signed)
 Left detailed message.

## 2024-01-17 ENCOUNTER — Other Ambulatory Visit: Payer: Self-pay

## 2024-01-17 MED ORDER — TERBINAFINE HCL 250 MG PO TABS: 250.0000 mg | ORAL_TABLET | Freq: Every day | ORAL | 0 refills | Status: AC

## 2024-03-20 ENCOUNTER — Other Ambulatory Visit: Payer: Self-pay | Admitting: Obstetrics and Gynecology

## 2024-03-20 ENCOUNTER — Telehealth: Payer: Self-pay | Admitting: Lab

## 2024-03-20 DIAGNOSIS — R928 Other abnormal and inconclusive findings on diagnostic imaging of breast: Secondary | ICD-10-CM

## 2024-03-20 NOTE — Telephone Encounter (Signed)
 Patient calling stating she need refill on medication for fungus last blood work was done 12/2023 she states if she needs blood work every month that isn't going to work can you advise.

## 2024-03-21 NOTE — Telephone Encounter (Signed)
"  Spoke to patient - informed  "

## 2024-04-12 ENCOUNTER — Ambulatory Visit: Admitting: Podiatry

## 2024-04-19 ENCOUNTER — Other Ambulatory Visit: Payer: Self-pay | Admitting: Obstetrics and Gynecology

## 2024-04-19 DIAGNOSIS — R928 Other abnormal and inconclusive findings on diagnostic imaging of breast: Secondary | ICD-10-CM

## 2024-04-19 DIAGNOSIS — N6001 Solitary cyst of right breast: Secondary | ICD-10-CM
